# Patient Record
Sex: Female | Born: 1990 | Race: Black or African American | Hispanic: No | Marital: Single | Smoking: Never smoker
Health system: Southern US, Community
[De-identification: ages and names within clinical notes are randomized; demographics above are authoritative.]

---

## 2019-06-12 ENCOUNTER — Emergency Department (HOSPITAL_COMMUNITY): Payer: Self-pay

## 2019-06-12 ENCOUNTER — Other Ambulatory Visit: Payer: Self-pay

## 2019-06-12 ENCOUNTER — Encounter (HOSPITAL_COMMUNITY): Payer: Self-pay | Admitting: *Deleted

## 2019-06-12 ENCOUNTER — Emergency Department (HOSPITAL_COMMUNITY)
Admission: EM | Admit: 2019-06-12 | Discharge: 2019-06-12 | Disposition: A | Discharge status: Home / Self Care | Payer: Self-pay | Attending: Emergency Medicine | Admitting: Emergency Medicine

## 2019-06-12 DIAGNOSIS — N72 Inflammatory disease of cervix uteri: Secondary | ICD-10-CM | POA: Insufficient documentation

## 2019-06-12 DIAGNOSIS — R10815 Periumbilic abdominal tenderness: Secondary | ICD-10-CM | POA: Insufficient documentation

## 2019-06-12 DIAGNOSIS — R1032 Left lower quadrant pain: Secondary | ICD-10-CM

## 2019-06-12 DIAGNOSIS — B373 Candidiasis of vulva and vagina: Secondary | ICD-10-CM | POA: Insufficient documentation

## 2019-06-12 DIAGNOSIS — B379 Candidiasis, unspecified: Secondary | ICD-10-CM

## 2019-06-12 LAB — CBC WITH DIFFERENTIAL/PLATELET
Abs Immature Granulocytes: 0.02 10*3/uL (ref 0.00–0.07)
Basophils Absolute: 0 10*3/uL (ref 0.0–0.1)
Basophils Relative: 0 %
Eosinophils Absolute: 0.1 10*3/uL (ref 0.0–0.5)
Eosinophils Relative: 1 %
HCT: 34.9 % — ABNORMAL LOW (ref 36.0–46.0)
Hemoglobin: 13 g/dL (ref 12.0–15.0)
Immature Granulocytes: 0 %
Lymphocytes Relative: 23 %
Lymphs Abs: 1.8 10*3/uL (ref 0.7–4.0)
MCH: 29.8 pg (ref 26.0–34.0)
MCHC: 37.2 g/dL — ABNORMAL HIGH (ref 30.0–36.0)
MCV: 80 fL (ref 80.0–100.0)
Monocytes Absolute: 0.5 10*3/uL (ref 0.1–1.0)
Monocytes Relative: 6 %
Neutro Abs: 5.7 10*3/uL (ref 1.7–7.7)
Neutrophils Relative %: 70 %
Platelets: 330 10*3/uL (ref 150–400)
RBC: 4.36 MIL/uL (ref 3.87–5.11)
RDW: 13.1 % (ref 11.5–15.5)
WBC: 8.1 10*3/uL (ref 4.0–10.5)
nRBC: 0 % (ref 0.0–0.2)

## 2019-06-12 LAB — URINALYSIS, ROUTINE W REFLEX MICROSCOPIC
Bacteria, UA: NONE SEEN
Bilirubin Urine: NEGATIVE
Glucose, UA: NEGATIVE mg/dL
Ketones, ur: NEGATIVE mg/dL
Leukocytes,Ua: NEGATIVE
Nitrite: NEGATIVE
Protein, ur: NEGATIVE mg/dL
Specific Gravity, Urine: 1.028 (ref 1.005–1.030)
pH: 5 (ref 5.0–8.0)

## 2019-06-12 LAB — COMPREHENSIVE METABOLIC PANEL
ALT: 15 U/L (ref 0–44)
AST: 13 U/L — ABNORMAL LOW (ref 15–41)
Albumin: 4 g/dL (ref 3.5–5.0)
Alkaline Phosphatase: 92 U/L (ref 38–126)
Anion gap: 10 (ref 5–15)
BUN: 10 mg/dL (ref 6–20)
CO2: 24 mmol/L (ref 22–32)
Calcium: 9 mg/dL (ref 8.9–10.3)
Chloride: 101 mmol/L (ref 98–111)
Creatinine, Ser: 0.75 mg/dL (ref 0.44–1.00)
GFR calc Af Amer: >60 mL/min (ref >60)
GFR calc non Af Amer: >60 mL/min (ref >60)
Glucose, Bld: 145 mg/dL — ABNORMAL HIGH (ref 70–99)
Potassium: 3.8 mmol/L (ref 3.5–5.1)
Sodium: 135 mmol/L (ref 135–145)
Total Bilirubin: 0.5 mg/dL (ref 0.3–1.2)
Total Protein: 7.6 g/dL (ref 6.5–8.1)

## 2019-06-12 LAB — I-STAT BETA HCG BLOOD, ED (MC, WL, AP ONLY): I-stat hCG, quantitative: <5 m[IU]/mL (ref <5)

## 2019-06-12 LAB — WET PREP, GENITAL
Clue Cells Wet Prep HPF POC: NONE SEEN
Sperm: NONE SEEN
Trich, Wet Prep: NONE SEEN
WBC, Wet Prep HPF POC: NONE SEEN

## 2019-06-12 MED ORDER — OXYCODONE-ACETAMINOPHEN 5-325 MG PO TABS
1.0000 | ORAL_TABLET | Freq: Once | ORAL | Status: AC
  Administered 2019-06-12: 1 via ORAL
  Filled 2019-06-12: qty 1

## 2019-06-12 MED ORDER — LIDOCAINE HCL (PF) 1 % IJ SOLN
INTRAMUSCULAR | Status: AC
  Administered 2019-06-12: 2.1 mL
  Filled 2019-06-12: qty 5

## 2019-06-12 MED ORDER — KETOROLAC TROMETHAMINE 30 MG/ML IJ SOLN
30.0000 mg | Freq: Once | INTRAMUSCULAR | Status: AC
  Administered 2019-06-12: 10:00:00 30 mg via INTRAVENOUS
  Filled 2019-06-12: qty 1

## 2019-06-12 MED ORDER — FLUCONAZOLE 150 MG PO TABS
150.0000 mg | ORAL_TABLET | Freq: Once | ORAL | Status: AC
  Administered 2019-06-12: 15:00:00 150 mg via ORAL
  Filled 2019-06-12: qty 1

## 2019-06-12 MED ORDER — DOXYCYCLINE HYCLATE 100 MG PO TABS
100.0000 mg | ORAL_TABLET | Freq: Once | ORAL | Status: AC
  Administered 2019-06-12: 100 mg via ORAL
  Filled 2019-06-12: qty 1

## 2019-06-12 MED ORDER — DOXYCYCLINE HYCLATE 100 MG PO CAPS: 100.0000 mg | ORAL_CAPSULE | Freq: Two times a day (BID) | ORAL | 0 refills | Status: AC

## 2019-06-12 MED ORDER — HYDROCODONE-ACETAMINOPHEN 5-325 MG PO TABS: 1.0000 | ORAL_TABLET | Freq: Four times a day (QID) | ORAL | 0 refills | Status: AC | PRN

## 2019-06-12 MED ORDER — METRONIDAZOLE 500 MG PO TABS: 500.0000 mg | ORAL_TABLET | Freq: Two times a day (BID) | ORAL | 0 refills | Status: AC

## 2019-06-12 MED ORDER — HYDROCODONE-ACETAMINOPHEN 5-325 MG PO TABS
1.0000 | ORAL_TABLET | Freq: Once | ORAL | Status: AC
  Administered 2019-06-12: 1 via ORAL
  Filled 2019-06-12: qty 1

## 2019-06-12 MED ORDER — CEFTRIAXONE SODIUM 250 MG IJ SOLR
250.0000 mg | Freq: Once | INTRAMUSCULAR | Status: AC
  Administered 2019-06-12: 250 mg via INTRAMUSCULAR
  Filled 2019-06-12: qty 250

## 2019-06-12 MED ORDER — IOHEXOL 300 MG/ML  SOLN
100.0000 mL | Freq: Once | INTRAMUSCULAR | Status: AC | PRN
  Administered 2019-06-12: 100 mL via INTRAVENOUS

## 2019-06-12 NOTE — ED Notes (Signed)
Patient transported to Ultrasound 

## 2019-06-12 NOTE — Discharge Instructions (Signed)
Take the antibiotics and pain medication as prescribed. Return for new or worsening symptoms. Do not drive or operate heavy machinery while taking the pain medication.

## 2019-06-12 NOTE — ED Triage Notes (Signed)
C/o left lower abd. Pain onset earlier today, denies n/v

## 2019-06-12 NOTE — ED Provider Notes (Signed)
[facility] EMERGENCY DEPARTMENT Provider Note   CSN: [id number] Arrival date & time: 06/12/19  0744   History   Chief Complaint Chief Complaint  Patient presents with   Abdominal Pain   HPI Sherry Hooper is a 28 y.o. female with medical history significant for C-section who presents for evaluation of abdominal pain.  Patient states she developed lower quadrant abdominal pain which began approximately at midnight, 8 hours PTA.  Patient describes her pain as sharp.  This pain is worse with movement and palpation to her left lower quadrant.  She is been taking Tylenol at home without relief of her pain.  Denies fever, chills, nausea, vomiting, chest pain, shortness of breath, dysuria, diarrhea, constipation, pelvic pain, vaginal discharge.  Patient states she is sexually active however is not concerned for STDs.  Rates her current pain a 10/10.  Denies additional aggravating or alleviating factors.  History obtained from patient and past medical records.  No interpreter was used.     HPI  History reviewed. No pertinent past medical history.  There are no active problems to display for this patient.   Past Surgical History:  Procedure Laterality Date   CESAREAN SECTION       OB History   No obstetric history on file.      Home Medications    Prior to Admission medications   Medication Sig Start Date End Date Taking? Authorizing Provider  doxycycline (VIBRAMYCIN) 100 MG capsule Take 1 capsule (100 mg total) by mouth 2 (two) times daily for 10 days. 06/12/19 06/22/19  Aren Pryde A, PA-C  HYDROcodone-acetaminophen (NORCO/VICODIN) 5-325 MG tablet Take 1 tablet by mouth every 6 (six) hours as needed. 06/12/19   Tylek Boney A, PA-C  metroNIDAZOLE (FLAGYL) 500 MG tablet Take 1 tablet (500 mg total) by mouth 2 (two) times daily. 06/12/19   Arthuro Canelo A, PA-C    Family History No family history on file.  Social History Social History   Tobacco  Use   Smoking status: Never Smoker   Smokeless tobacco: Never Used  Substance Use Topics   Alcohol use: Not on file   Drug use: Not on file     Allergies   Patient has no allergy information on record.   Review of Systems Review of Systems  Constitutional: Negative.   HENT: Negative.   Respiratory: Negative.   Cardiovascular: Negative.   Gastrointestinal: Positive for abdominal pain. Negative for abdominal distention, anal bleeding, blood in stool, constipation, diarrhea, nausea, rectal pain and vomiting.  Genitourinary: Negative.   Musculoskeletal: Negative.   Skin: Negative.   Neurological: Negative.   All other systems reviewed and are negative.    Physical Exam Updated Vital Signs BP 130/87 (BP Location: Right Arm)    Pulse (!) 55    Temp 99.4 F (37.4 C) (Oral)    Resp 16    Ht  (1.575 m)    Wt 86.2 kg    SpO2 100%    BMI 34.75 kg/m   Physical Exam Vitals signs and nursing note reviewed. Exam conducted with a chaperone present.  Constitutional:      General: She is not in acute distress.    Appearance: She is well-developed. She is not ill-appearing or toxic-appearing.  HENT:     Head: Normocephalic and atraumatic.     Mouth/Throat:     Mouth: Mucous membranes are moist.     Pharynx: Oropharynx is clear.  Eyes:     Pupils: Pupils are  equal, round, and reactive to light.  Neck:     Musculoskeletal: Normal range of motion.  Cardiovascular:     Rate and Rhythm: Normal rate.     Heart sounds: Normal heart sounds.  Pulmonary:     Effort: Pulmonary effort is normal. No respiratory distress.     Breath sounds: Normal breath sounds.  Abdominal:     General: Bowel sounds are normal. There is no distension.     Palpations: Abdomen is soft.     Tenderness: There is abdominal tenderness in the suprapubic area and left lower quadrant. There is no right CVA tenderness, left CVA tenderness, guarding or rebound.     Hernia: There is no hernia in the left  inguinal area or right inguinal area.    Genitourinary:    General: Normal vulva.     Pubic Area: No rash.      Labia:        Right: No rash, tenderness, lesion or injury.        Left: No rash, tenderness, lesion or injury.        Comments: Normal appearing external female genitalia without rashes or lesions, normal vaginal epithelium. Normal appearing cervix with yellow discharge. No cervical petechiae. Cervical os is closed. There is no bleeding noted at the os. No Odor. Bimanual: Mild CMT, No adnexal tenderness.  No palpable adnexal masses or tenderness. Uterus midline and not fixed. Rectovaginal exam was deferred.  No cystocele or rectocele noted. No pelvic lymphadenopathy noted. Wet prep was obtained.  Cultures for gonorrhea and chlamydia collected. Exam performed with chaperone in room. Musculoskeletal: Normal range of motion.     Comments: Moves all 4 extremities without difficulty.  Lymphadenopathy:     Lower Body: No right inguinal adenopathy. No left inguinal adenopathy.  Skin:    General: Skin is warm and dry.  Neurological:     Mental Status: She is alert.    ED Treatments / Results  Labs (all labs ordered are listed, but only abnormal results are displayed) Labs Reviewed  WET PREP, GENITAL - Abnormal; Notable for the following components:      Result Value   Yeast Wet Prep HPF POC PRESENT (*)    All other components within normal limits  CBC WITH DIFFERENTIAL/PLATELET - Abnormal; Notable for the following components:   HCT 34.9 (*)    MCHC 37.2 (*)    All other components within normal limits  COMPREHENSIVE METABOLIC PANEL - Abnormal; Notable for the following components:   Glucose, Bld 145 (*)    AST 13 (*)    All other components within normal limits  URINALYSIS, ROUTINE W REFLEX MICROSCOPIC - Abnormal; Notable for the following components:   APPearance HAZY (*)    Hgb urine dipstick SMALL (*)    All other components within normal limits  I-STAT BETA HCG  BLOOD, ED ([facility], [facility], [facility] ONLY)  GC/CHLAMYDIA PROBE AMP ([place]    EKG None  Radiology [place] Transvaginal Non-ob  Result Date: 06/12/2019 CLINICAL DATA:  Left lower quadrant pain EXAM: TRANSABDOMINAL AND TRANSVAGINAL ULTRASOUND OF PELVIS DOPPLER ULTRASOUND OF OVARIES TECHNIQUE: Both transabdominal and transvaginal ultrasound examinations of the pelvis were performed. Transabdominal technique was performed for global imaging of the pelvis including uterus, ovaries, adnexal regions, and pelvic cul-de-sac. It was necessary to proceed with endovaginal exam following the transabdominal exam to visualize the uterus, endometrium, ovaries, and adnexa. Color and duplex Doppler ultrasound was utilized to evaluate blood flow to the  ovaries. COMPARISON:  None. FINDINGS: Uterus Measurements: 9.1 x 5.6 x 6.9 = volume: 184 mL. No fibroids or other mass visualized. Endometrium Thickness: 20 mm. No focal abnormality visualized. Decidual appearance. Right ovary Measurements: 2.8 x 2.3 x 2.9 cm = volume: 10 mL. Normal appearance/no adnexal mass. Subcentimeter follicles. Left ovary Measurements: 3.6 x 1.3 x 2.8 cm = volume: 7 mL. Normal appearance/no adnexal mass. Subcentimeter follicles. Pulsed Doppler evaluation of both ovaries demonstrates normal low-resistance arterial and venous waveforms. Other findings No abnormal free fluid. IMPRESSION: No ultrasound abnormality of the pelvis to explain left lower quadrant pain. Physiologic, decidual appearance of the endometrium. Subcentimeter ovarian follicles, normal in the reproductive age setting. Electronically Signed   By: Lauralyn Primes M.D.   On: 06/12/2019 11:30   US Pelvis Complete  Result Date: 06/12/2019 CLINICAL DATA:  Left lower quadrant pain EXAM: TRANSABDOMINAL AND TRANSVAGINAL ULTRASOUND OF PELVIS DOPPLER ULTRASOUND OF OVARIES TECHNIQUE: Both transabdominal and transvaginal ultrasound examinations of the pelvis were performed. Transabdominal technique  was performed for global imaging of the pelvis including uterus, ovaries, adnexal regions, and pelvic cul-de-sac. It was necessary to proceed with endovaginal exam following the transabdominal exam to visualize the uterus, endometrium, ovaries, and adnexa. Color and duplex Doppler ultrasound was utilized to evaluate blood flow to the ovaries. COMPARISON:  None. FINDINGS: Uterus Measurements: 9.1 x 5.6 x 6.9 = volume: 184 mL. No fibroids or other mass visualized. Endometrium Thickness: 20 mm. No focal abnormality visualized. Decidual appearance. Right ovary Measurements: 2.8 x 2.3 x 2.9 cm = volume: 10 mL. Normal appearance/no adnexal mass. Subcentimeter follicles. Left ovary Measurements: 3.6 x 1.3 x 2.8 cm = volume: 7 mL. Normal appearance/no adnexal mass. Subcentimeter follicles. Pulsed Doppler evaluation of both ovaries demonstrates normal low-resistance arterial and venous waveforms. Other findings No abnormal free fluid. IMPRESSION: No ultrasound abnormality of the pelvis to explain left lower quadrant pain. Physiologic, decidual appearance of the endometrium. Subcentimeter ovarian follicles, normal in the reproductive age setting. Electronically Signed   By: Lauralyn Primes M.D.   On: 06/12/2019 11:30   Ct Abdomen Pelvis W Contrast  Result Date: 06/12/2019 CLINICAL DATA:  Generalized abdominal pain. EXAM: CT ABDOMEN AND PELVIS WITH CONTRAST TECHNIQUE: Multidetector CT imaging of the abdomen and pelvis was performed using the standard protocol following bolus administration of intravenous contrast. CONTRAST:  OMNIPAQUE IOHEXOL 300 MG/ML  SOLN COMPARISON:  None. FINDINGS: Lower chest: No acute abnormality. Hepatobiliary: No focal liver abnormality is seen. No gallstones, gallbladder wall thickening, or biliary dilatation. Pancreas: Unremarkable. No pancreatic ductal dilatation or surrounding inflammatory changes. Spleen: Normal in size without focal abnormality. Adrenals/Urinary Tract: Adrenal glands are  unremarkable. Kidneys are normal, without renal calculi, focal lesion, or hydronephrosis. Bladder is unremarkable. Stomach/Bowel: Stomach is within normal limits. Appendix appears normal. No evidence of bowel wall thickening, distention, or inflammatory changes. Vascular/Lymphatic: No significant vascular findings are present. No enlarged abdominal or pelvic lymph nodes. Reproductive: Uterus and bilateral adnexa are unremarkable. Other: No abdominal wall hernia or abnormality. No abdominopelvic ascites. Musculoskeletal: No acute or significant osseous findings. IMPRESSION: No abnormality seen in the abdomen or pelvis. Electronically Signed   By: Lupita Raider M.D.   On: 06/12/2019 12:49   [place] Art/ven Flow Abd Pelv Doppler  Result Date: 06/12/2019 CLINICAL DATA:  Left lower quadrant pain EXAM: TRANSABDOMINAL AND TRANSVAGINAL ULTRASOUND OF PELVIS DOPPLER ULTRASOUND OF OVARIES TECHNIQUE: Both transabdominal and transvaginal ultrasound examinations of the pelvis were performed. Transabdominal technique was performed for global imaging of the  pelvis including uterus, ovaries, adnexal regions, and pelvic cul-de-sac. It was necessary to proceed with endovaginal exam following the transabdominal exam to visualize the uterus, endometrium, ovaries, and adnexa. Color and duplex Doppler ultrasound was utilized to evaluate blood flow to the ovaries. COMPARISON:  None. FINDINGS: Uterus Measurements: 9.1 x 5.6 x 6.9 = volume: 184 mL. No fibroids or other mass visualized. Endometrium Thickness: 20 mm. No focal abnormality visualized. Decidual appearance. Right ovary Measurements: 2.8 x 2.3 x 2.9 cm = volume: 10 mL. Normal appearance/no adnexal mass. Subcentimeter follicles. Left ovary Measurements: 3.6 x 1.3 x 2.8 cm = volume: 7 mL. Normal appearance/no adnexal mass. Subcentimeter follicles. Pulsed Doppler evaluation of both ovaries demonstrates normal low-resistance arterial and venous waveforms. Other findings No abnormal  free fluid. IMPRESSION: No ultrasound abnormality of the pelvis to explain left lower quadrant pain. Physiologic, decidual appearance of the endometrium. Subcentimeter ovarian follicles, normal in the reproductive age setting. Electronically Signed   By: Eddie Candle M.D.   On: 06/12/2019 11:30    Procedures Procedures (including critical care time)  Medications Ordered in ED Medications  cefTRIAXone (ROCEPHIN) injection 250 mg (has no administration in time range)  doxycycline (VIBRA-TABS) tablet 100 mg (has no administration in time range)  fluconazole (DIFLUCAN) tablet 150 mg (has no administration in time range)  HYDROcodone-acetaminophen (NORCO/VICODIN) 5-325 MG per tablet 1 tablet (1 tablet Oral Given 06/12/19 0915)  ketorolac (TORADOL) 30 MG/ML injection 30 mg (30 mg Intravenous Given 06/12/19 0950)  oxyCODONE-acetaminophen (PERCOCET/ROXICET) 5-325 MG per tablet 1 tablet (1 tablet Oral Given 06/12/19 1156)  iohexol (OMNIPAQUE) 300 MG/ML solution 100 mL (100 mLs Intravenous Contrast Given 06/12/19 1209)   Initial Impression / Assessment and Plan / ED Course  I have reviewed the triage vital signs and the nursing notes.  Pertinent labs & imaging results that were available during my care of the patient were reviewed by me and considered in my medical decision making (see chart for details).  28 year old female appears otherwise well presents for evaluation of left lower quadrant pain.  She is afebrile, nonseptic, non-ill-appearing.  Tenderness to left lower quadrant.  No diarrhea, constipation to suggest diverticulitis.  She is tolerating p.o. intake at home.  Heart and lungs clear.  Normoactive bowel sounds.  No overlying skin changes to abdominal wall.  GU exam with yellow discharge with mild CMT tenderness.  No adnexal tenderness.  Ultrasound evidence of torsion, TOA, cyst.  Patient has not wanting IV pain medication at this time.  Requesting p.o. Percocet which she has taken previously with  relief.   CBC without leukocytosis Metabolic panel with mild hyperglycemia at 145, pregnancy test negative, wet prep with yeast urinalysis negative for infection [place] no torsion, No pelvic pathology  Patient requesting additional pain medication. Still does not want IV medication. Will order additional Norco. Given no cause for pain will order CT.  Ct AP with acute findings possible,llq pain due to cervicitis. Will dc home with abx and give abx in ED.  Patient is nontoxic, nonseptic appearing, in no apparent distress.  Patient's pain and other symptoms adequately managed in emergency department.  Fluid bolus given.  Labs, imaging and vitals reviewed.  Patient does not meet the SIRS or Sepsis criteria.  On repeat exam patient does not have a surgical abdomin and there are no peritoneal signs.  No indication of appendicitis, bowel obstruction, bowel perforation, cholecystitis, diverticulitis, torsion, TOA, PID or ectopic pregnancy.  Patient discharged home with symptomatic treatment and given strict instructions for  follow-up with their primary care physician.  I have also discussed reasons to return immediately to the ER.  Patient expresses understanding and agrees with plan.          Final Clinical Impressions(s) / ED Diagnoses   Final diagnoses:  LLQ pain  Cervicitis  Yeast infection    ED Discharge Orders         Ordered    doxycycline (VIBRAMYCIN) 100 MG capsule  2 times daily     06/12/19 1325    metroNIDAZOLE (FLAGYL) 500 MG tablet  2 times daily     06/12/19 1325    HYDROcodone-acetaminophen (NORCO/VICODIN) 5-325 MG tablet  Every 6 hours PRN     06/12/19 1325           Derriana Oser A, PA-C 06/12/19 1331    Raeford Razor, MD 06/13/19 [phone]

## 2019-06-12 NOTE — ED Notes (Signed)
ED Provider at bedside. 

## 2019-06-14 LAB — CERVICOVAGINAL ANCILLARY ONLY
Chlamydia: NEGATIVE
Neisseria Gonorrhea: NEGATIVE

## 2020-12-05 IMAGING — US US PELVIS COMPLETE
2 series · 13 of 25 positions shown · non-contrast
Comparison: None.

CLINICAL DATA: Left lower quadrant pain

EXAM:
TRANSABDOMINAL AND TRANSVAGINAL ULTRASOUND OF PELVIS
DOPPLER ULTRASOUND OF OVARIES
TECHNIQUE: Both transabdominal and transvaginal ultrasound examinations of the
pelvis were performed. Transabdominal technique was performed for
global imaging of the pelvis including uterus, ovaries, adnexal
regions, and pelvic cul-de-sac.
It was necessary to proceed with endovaginal exam following the
transabdominal exam to visualize the uterus, endometrium, ovaries,
and adnexa. Color and duplex Doppler ultrasound was utilized to
evaluate blood flow to the ovaries.

[Series 1: us pelvis complete · 12 of 100 slices shown (1 of 2)]
[im 1/100]
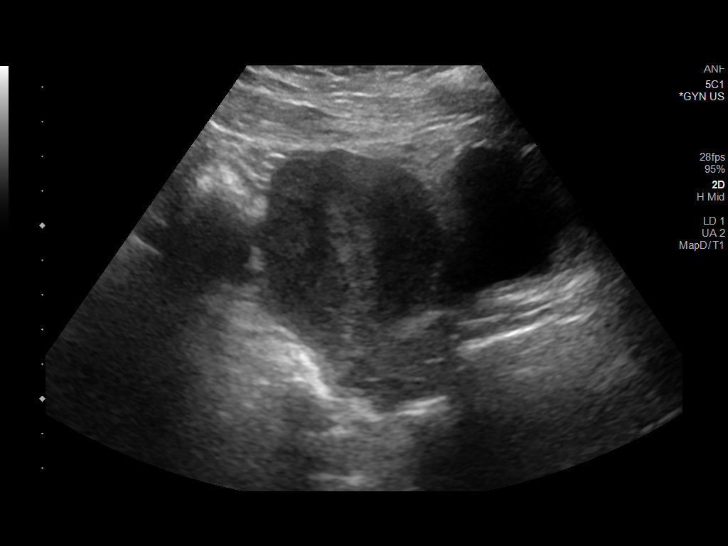
[im 9/100]
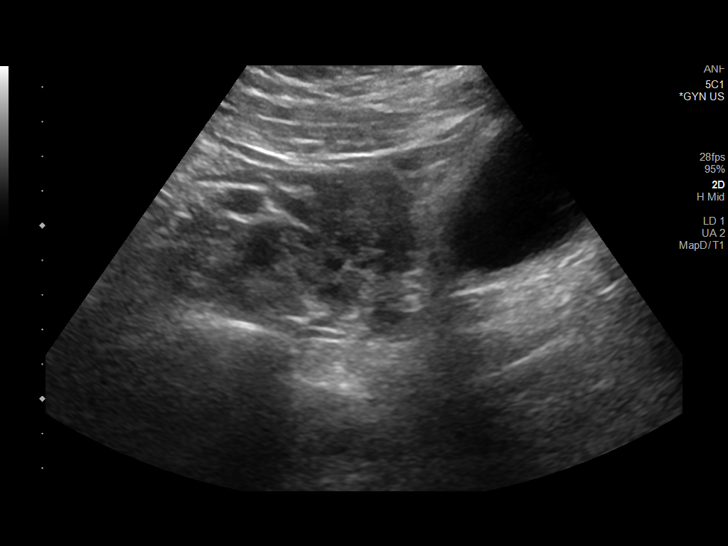
[im 18/100]
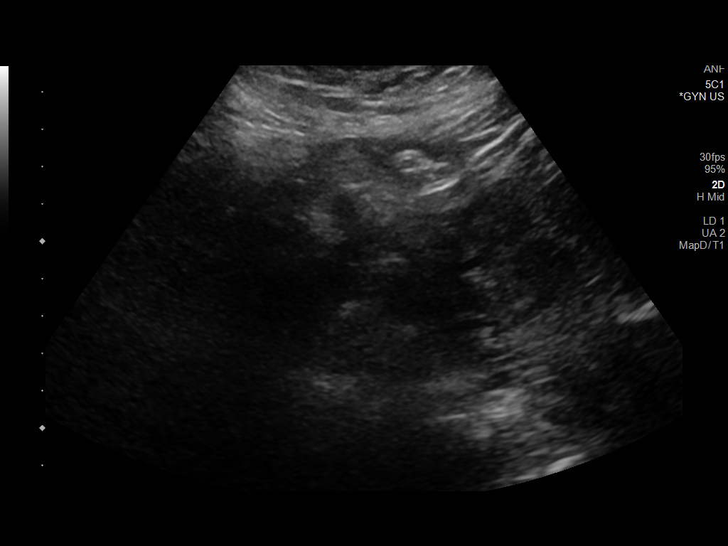
[im 26/100]
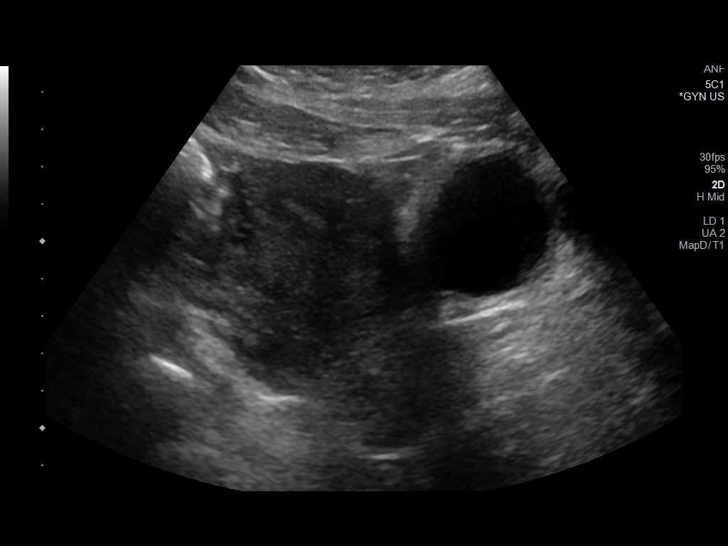
[im 35/100]
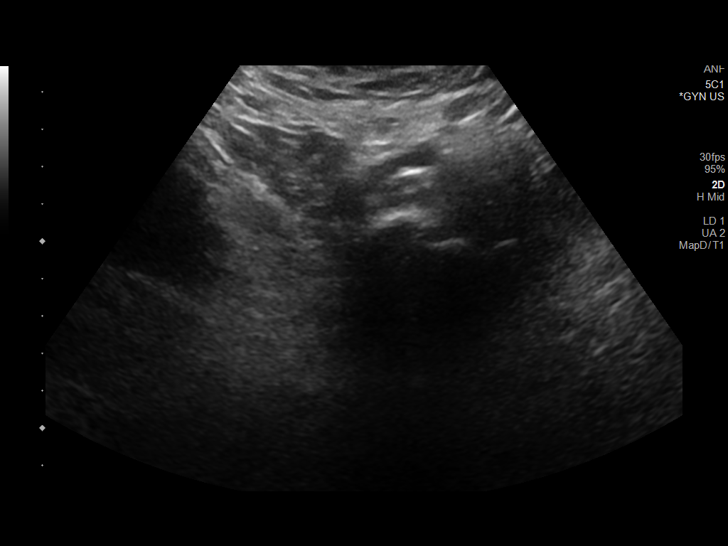
[im 44/100]
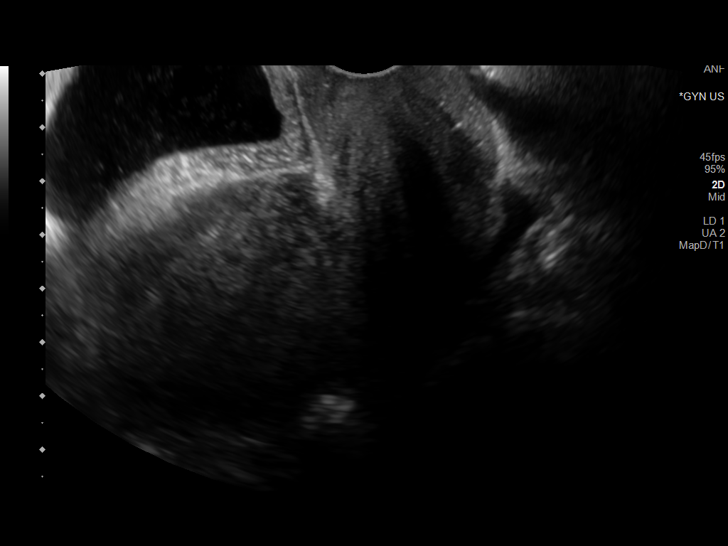
[im 52/100]
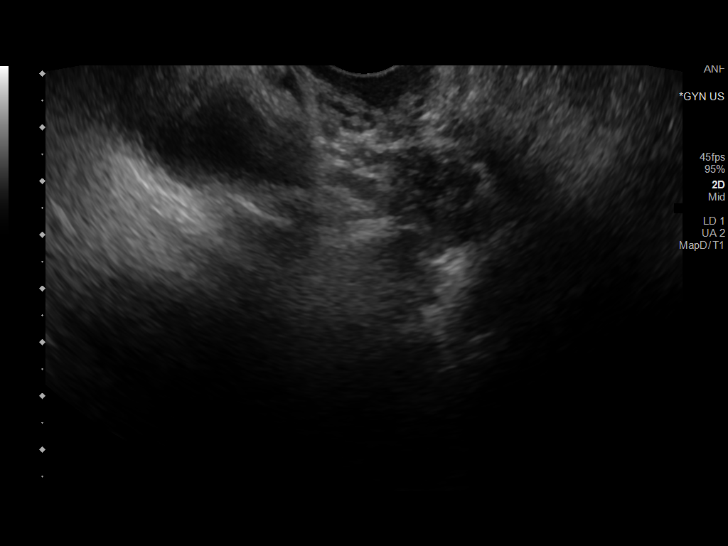
[im 61/100]
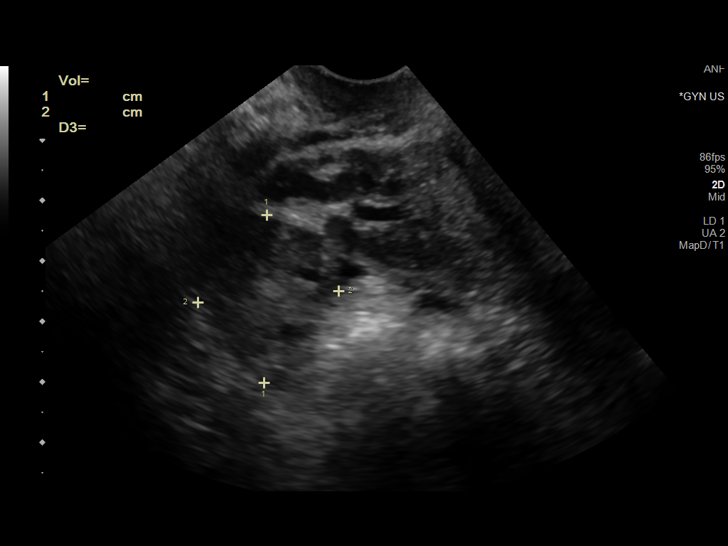
[im 69/100]
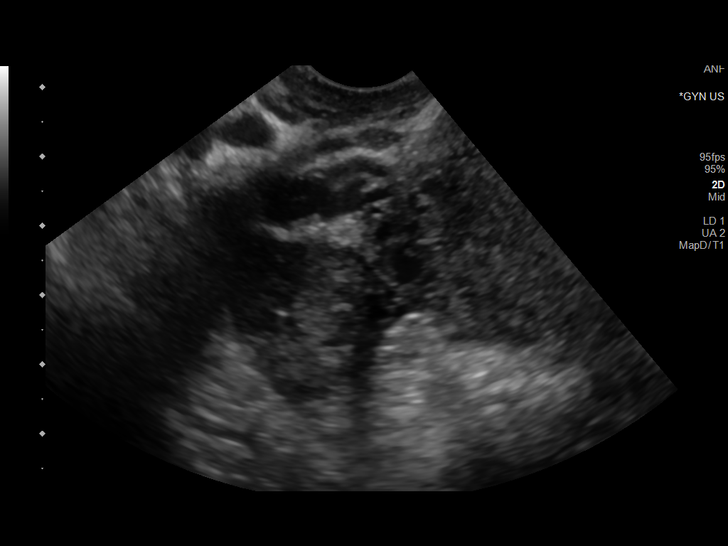
[im 78/100]
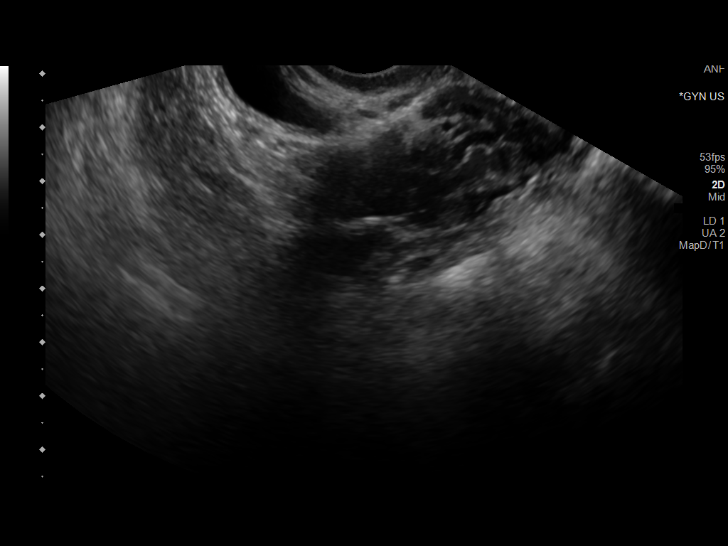
[im 87/100]
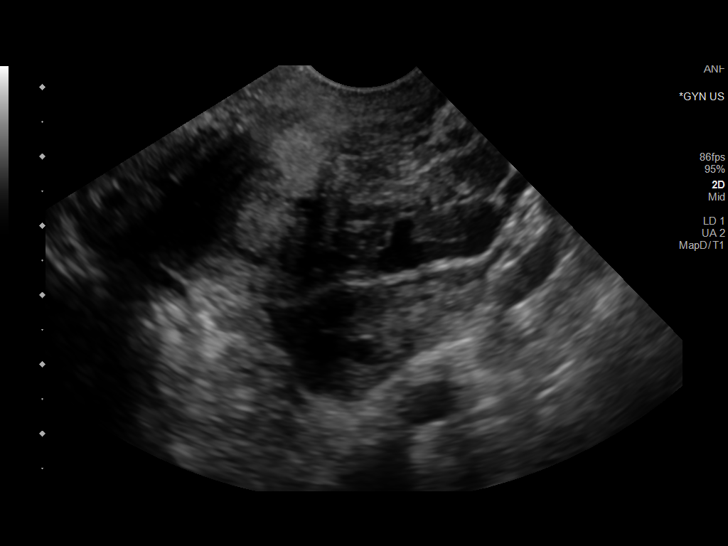
[im 95/100]
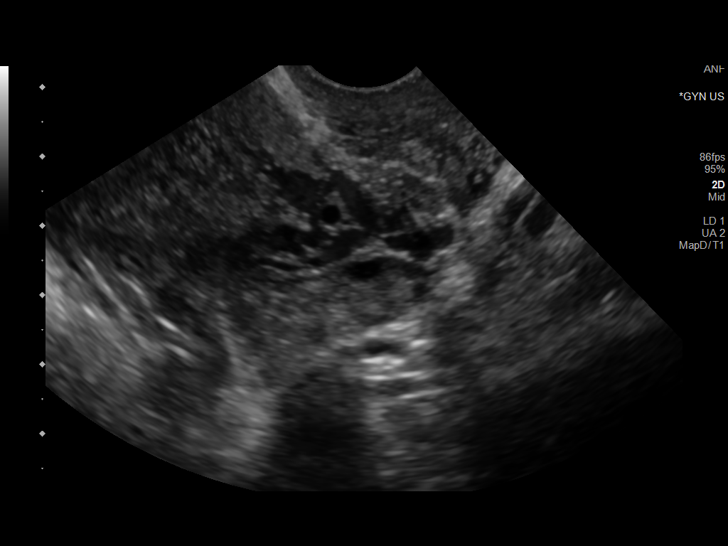

[Series 2: us pelvis complete · 1 of 2 slices shown (2 of 2)]
[im 1/2]
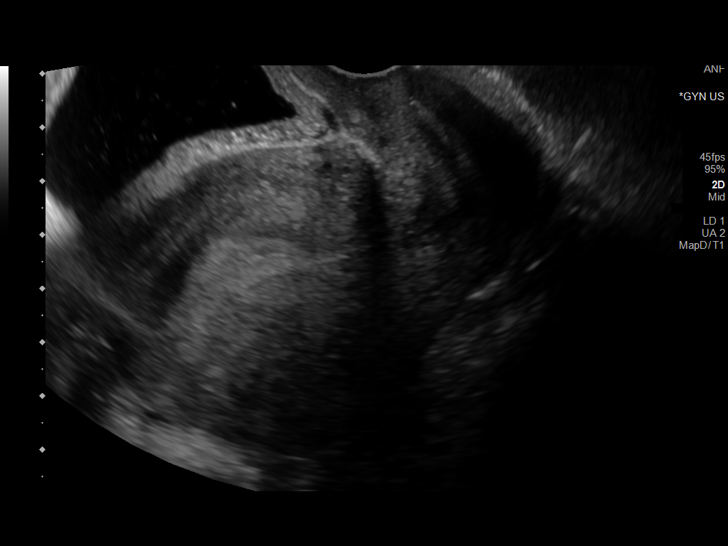

[13 of 25 positions shown; findings below may reference images not displayed]

FINDINGS: Uterus

Measurements: 9.1 x 5.6 x 6.9 = volume: 184 mL. No fibroids or other
mass visualized.

Endometrium

Thickness: 20 mm. No focal abnormality visualized. Decidual
appearance.

Right ovary

Measurements: 2.8 x 2.3 x 2.9 cm = volume: 10 mL. Normal
appearance/no adnexal mass. Subcentimeter follicles.

Left ovary

Measurements: 3.6 x 1.3 x 2.8 cm = volume: 7 mL. Normal
appearance/no adnexal mass. Subcentimeter follicles.

Pulsed Doppler evaluation of both ovaries demonstrates normal
low-resistance arterial and venous waveforms.

Other findings

No abnormal free fluid.
IMPRESSION: No ultrasound abnormality of the pelvis to explain left lower
quadrant pain. Physiologic, decidual appearance of the endometrium.
Subcentimeter ovarian follicles, normal in the reproductive age
setting.

## 2020-12-05 IMAGING — US US TRANSVAGINAL NON-OB
2 series · 13 of 25 positions shown · non-contrast
Comparison: None.

CLINICAL DATA: Left lower quadrant pain

EXAM:
TRANSABDOMINAL AND TRANSVAGINAL ULTRASOUND OF PELVIS
DOPPLER ULTRASOUND OF OVARIES
TECHNIQUE: Both transabdominal and transvaginal ultrasound examinations of the
pelvis were performed. Transabdominal technique was performed for
global imaging of the pelvis including uterus, ovaries, adnexal
regions, and pelvic cul-de-sac.
It was necessary to proceed with endovaginal exam following the
transabdominal exam to visualize the uterus, endometrium, ovaries,
and adnexa. Color and duplex Doppler ultrasound was utilized to
evaluate blood flow to the ovaries.

[Series 1: us transvaginal non-ob · 12 of 100 slices shown (1 of 2)]
[im 1/100]
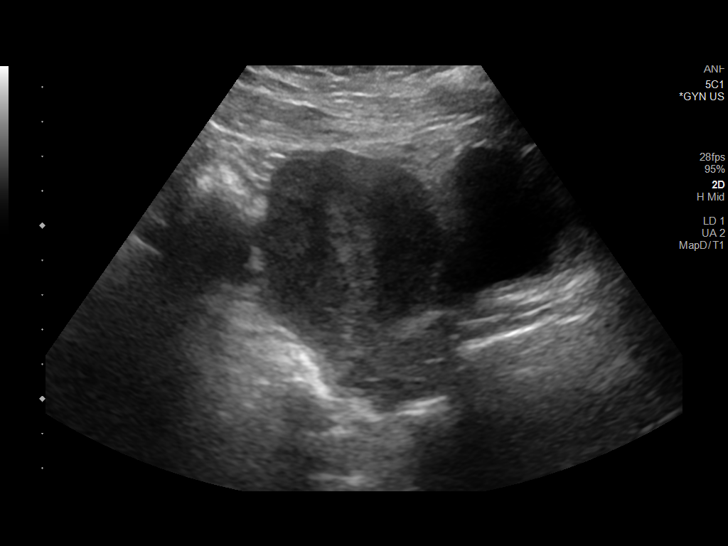
[im 9/100]
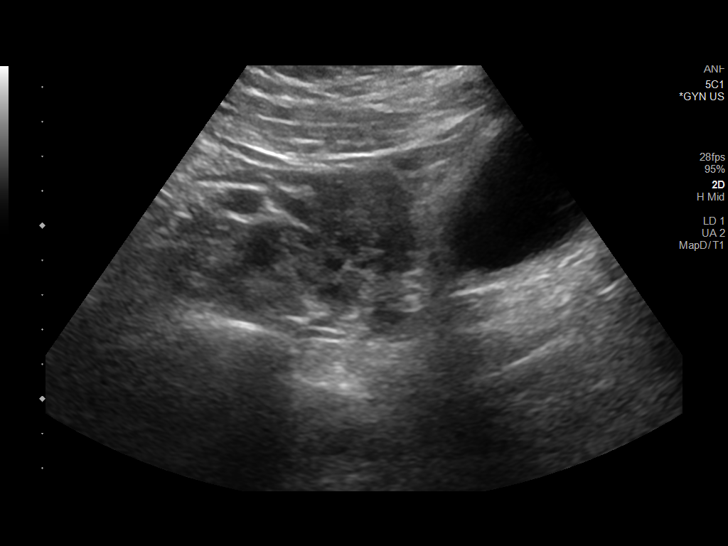
[im 18/100]
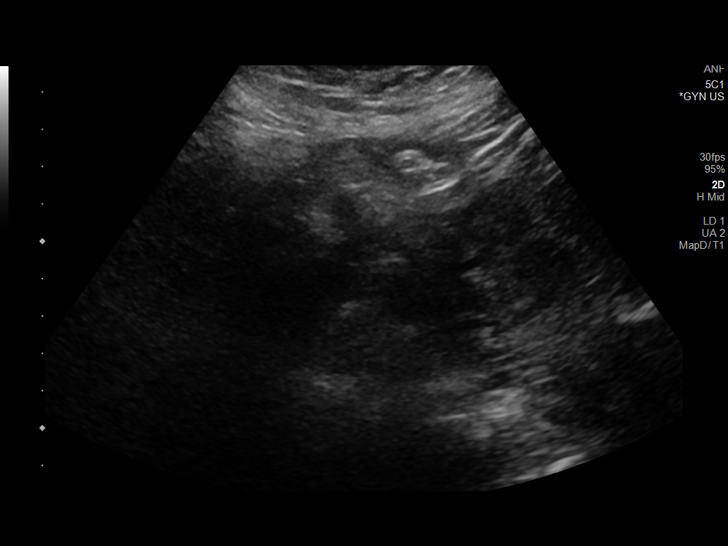
[im 26/100]
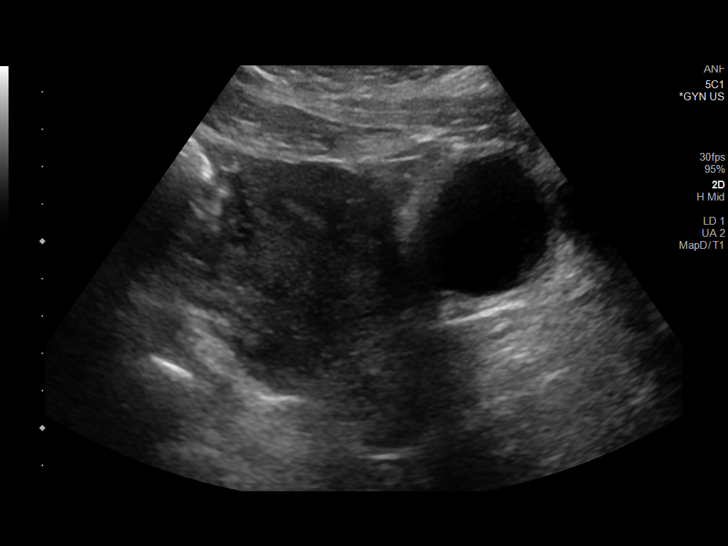
[im 35/100]
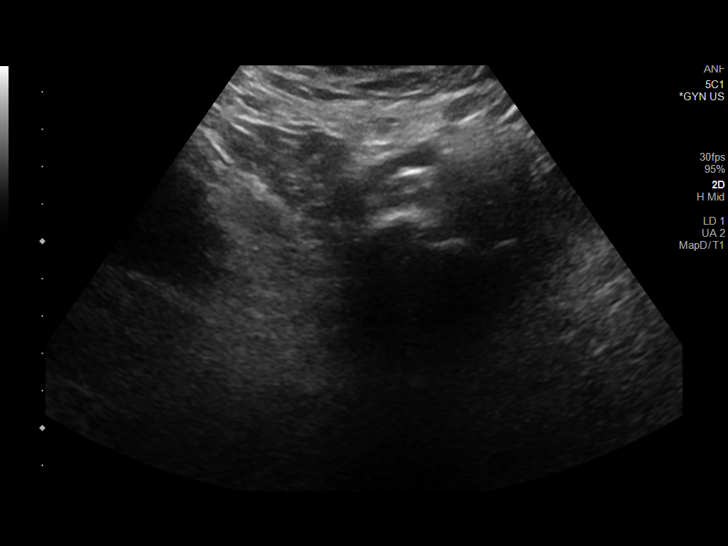
[im 44/100]
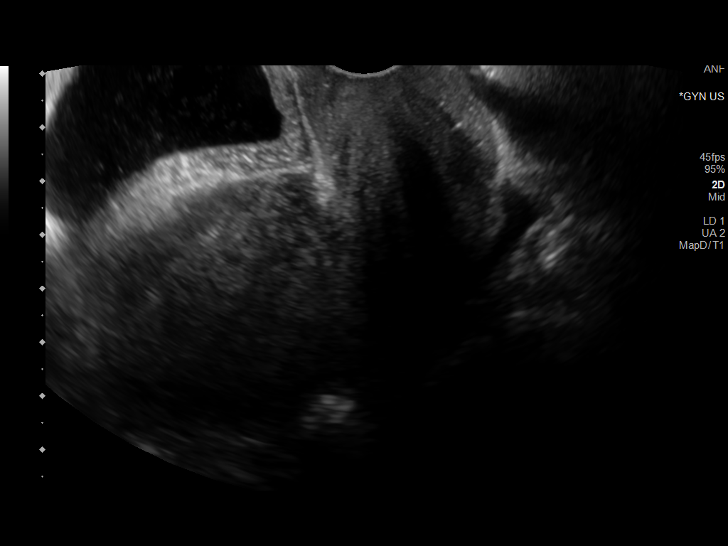
[im 52/100]
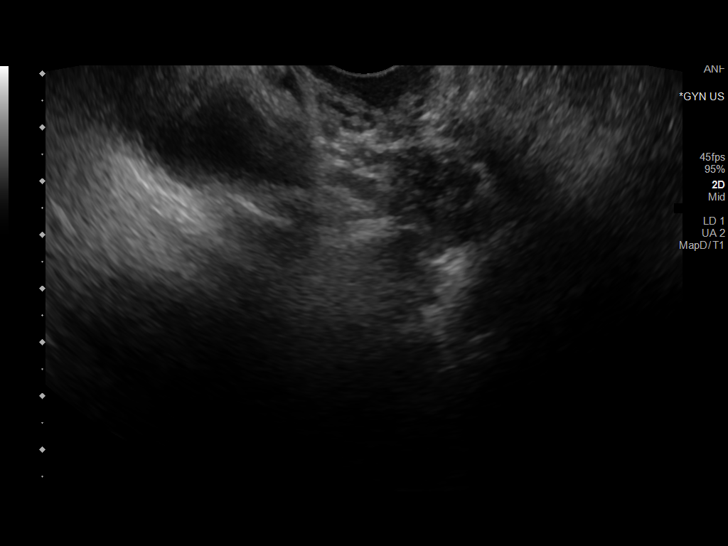
[im 61/100]
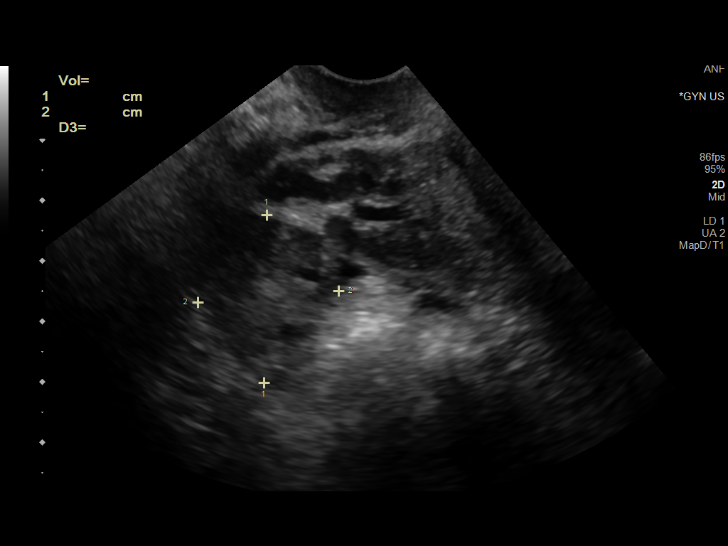
[im 69/100]
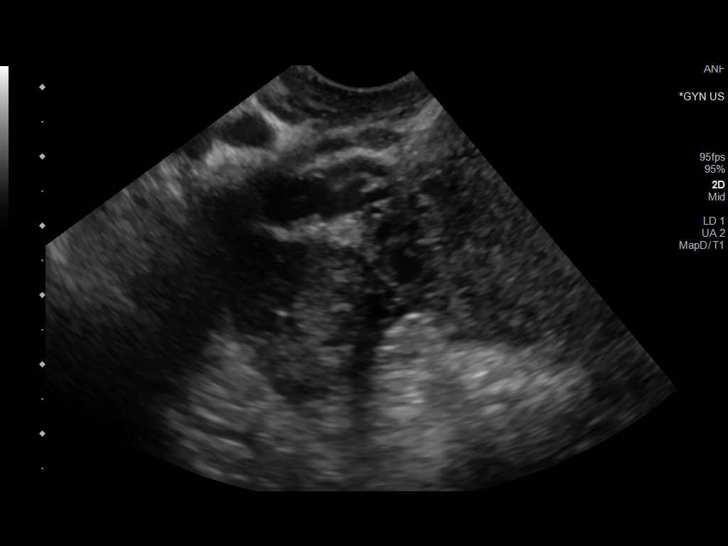
[im 78/100]
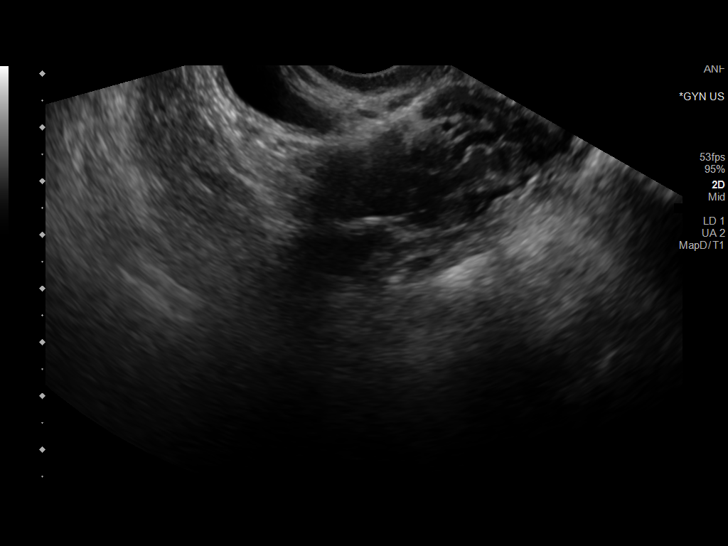
[im 87/100]
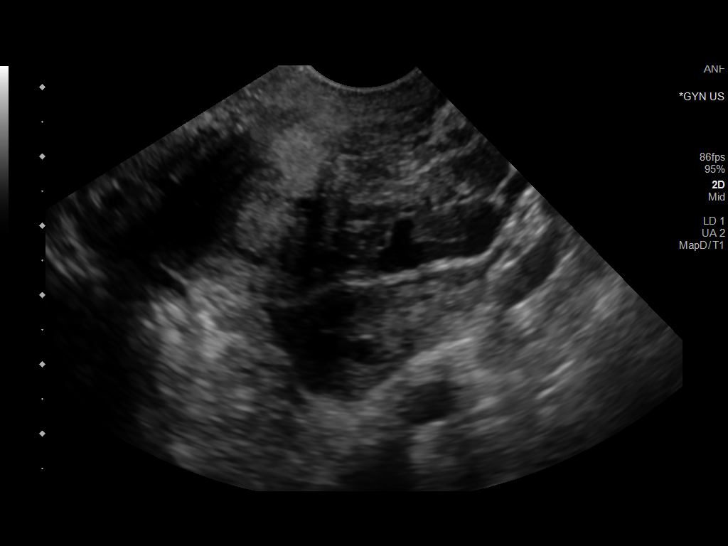
[im 95/100]
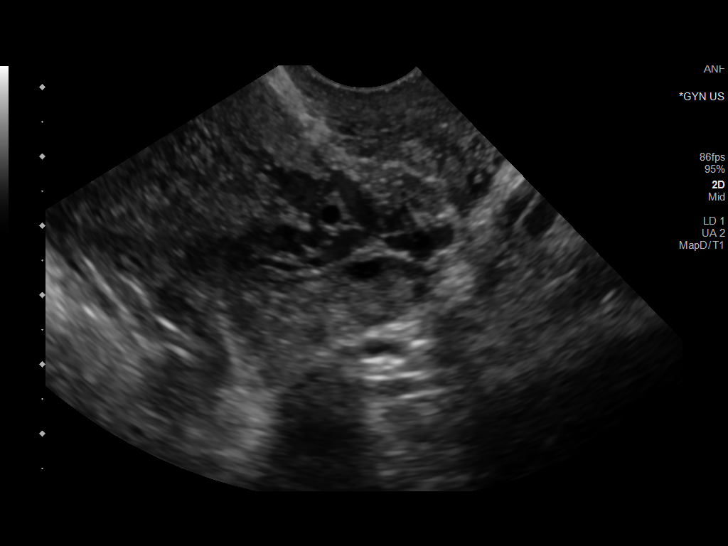

[Series 2: us transvaginal non-ob · 1 of 2 slices shown (2 of 2)]
[im 1/2]
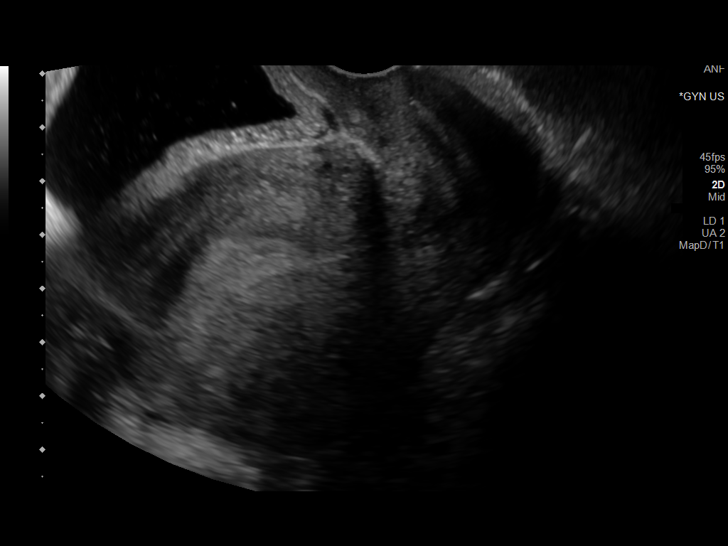

[13 of 25 positions shown; findings below may reference images not displayed]

FINDINGS: Uterus

Measurements: 9.1 x 5.6 x 6.9 = volume: 184 mL. No fibroids or other
mass visualized.

Endometrium

Thickness: 20 mm. No focal abnormality visualized. Decidual
appearance.

Right ovary

Measurements: 2.8 x 2.3 x 2.9 cm = volume: 10 mL. Normal
appearance/no adnexal mass. Subcentimeter follicles.

Left ovary

Measurements: 3.6 x 1.3 x 2.8 cm = volume: 7 mL. Normal
appearance/no adnexal mass. Subcentimeter follicles.

Pulsed Doppler evaluation of both ovaries demonstrates normal
low-resistance arterial and venous waveforms.

Other findings

No abnormal free fluid.
IMPRESSION: No ultrasound abnormality of the pelvis to explain left lower
quadrant pain. Physiologic, decidual appearance of the endometrium.
Subcentimeter ovarian follicles, normal in the reproductive age
setting.

## 2020-12-05 IMAGING — CT CT ABD-PELV W/ CM
2 of 4 series · 16 of 46 positions shown, 18 images · IV contrast (omnipaque)
Comparison: None.

CLINICAL DATA: Generalized abdominal pain.

EXAM:
CT ABDOMEN AND PELVIS WITH CONTRAST
TECHNIQUE: Multidetector CT imaging of the abdomen and pelvis was performed
using the standard protocol following bolus administration of
intravenous contrast.
CONTRAST:  100mL OMNIPAQUE IOHEXOL 300 MG/ML  SOLN

[Series 3: abd/ pelvis 5.0 i30f 2 · axial · 0.98mm/px · z∈[+806,+1276]mm · 13 of 102 slices shown, 15 images]
[im 4/102  soft-tissue]
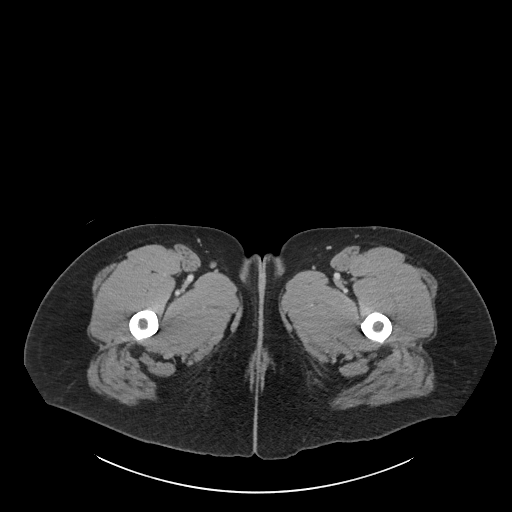
[im 4/102  bone]
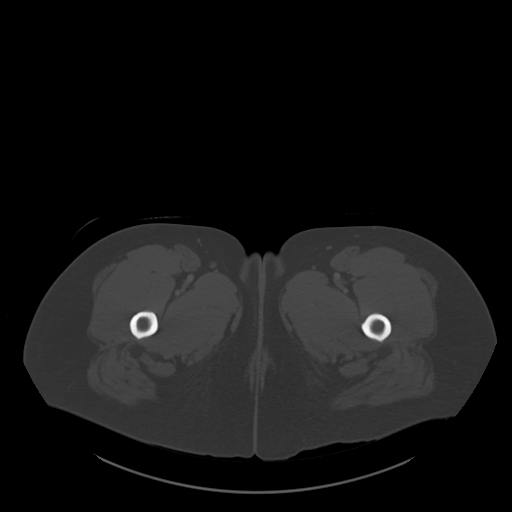
[im 12/102  soft-tissue]
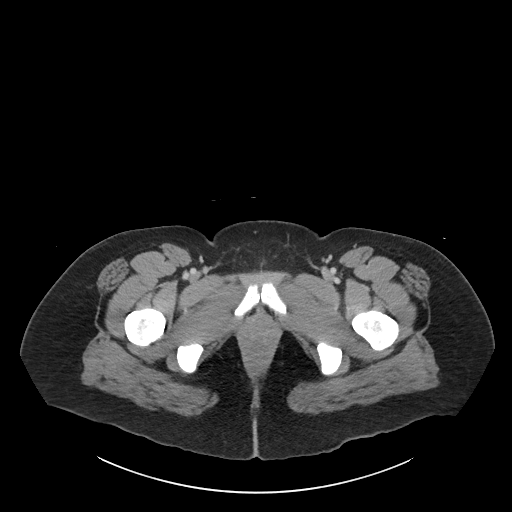
[im 20/102  soft-tissue]
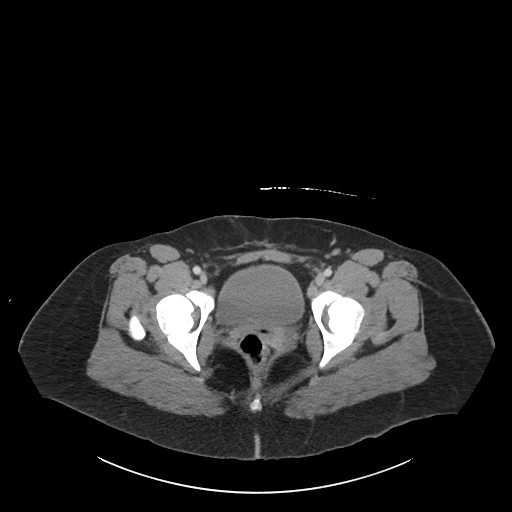
[im 28/102  soft-tissue]
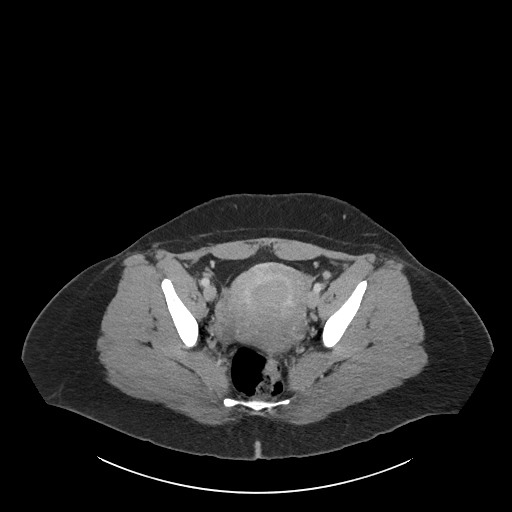
[im 35/102  soft-tissue]
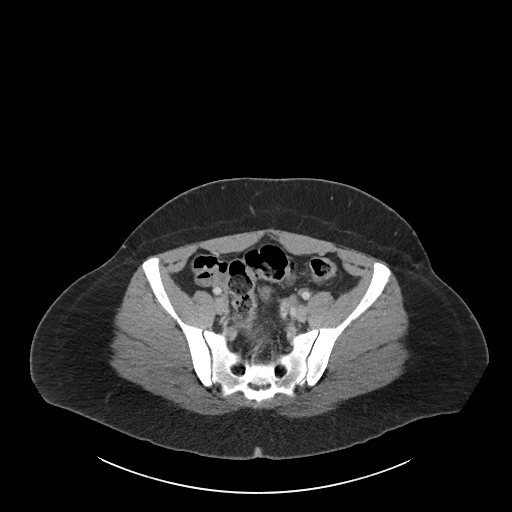
[im 43/102  soft-tissue]
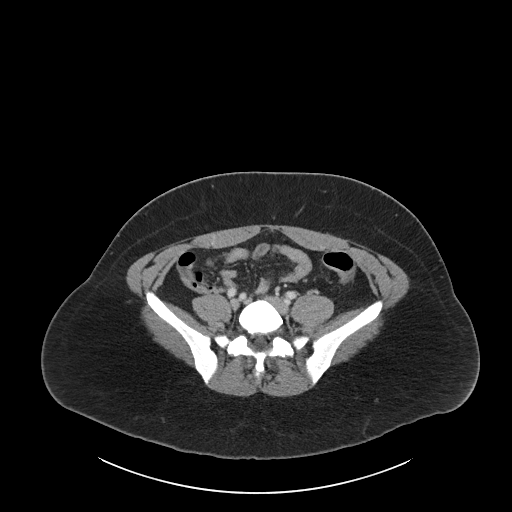
[im 51/102  soft-tissue]
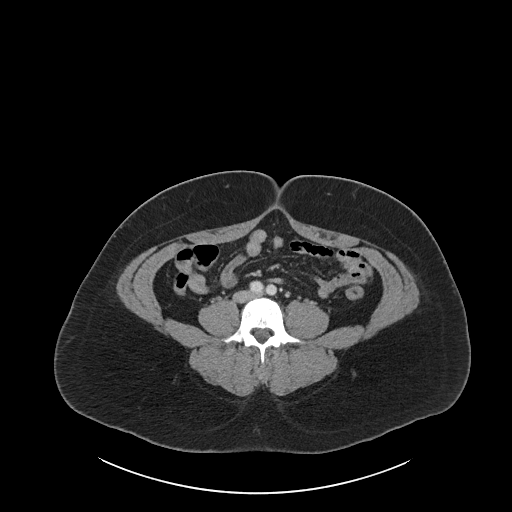
[im 59/102  soft-tissue]
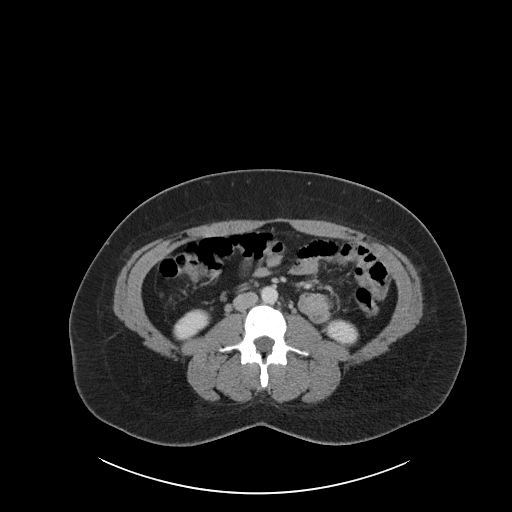
[im 67/102  soft-tissue]
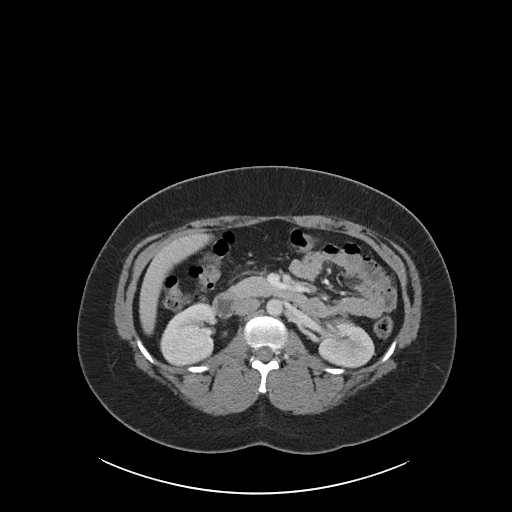
[im 67/102  bone]
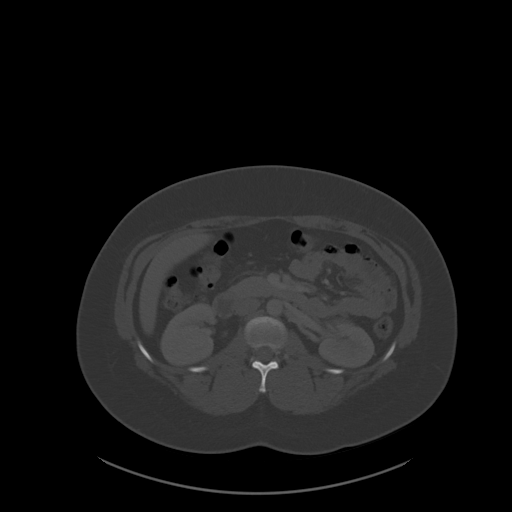
[im 74/102  soft-tissue]
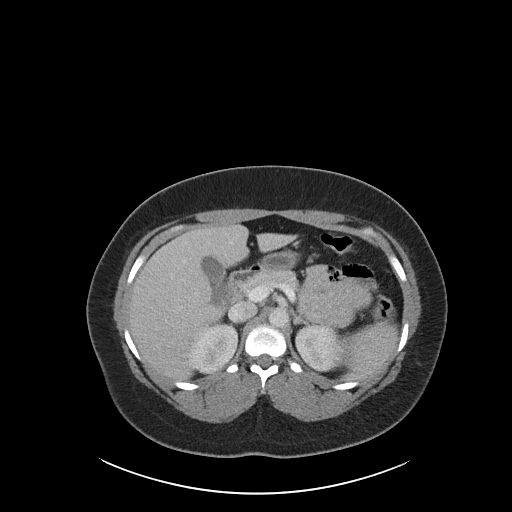
[im 82/102  soft-tissue]
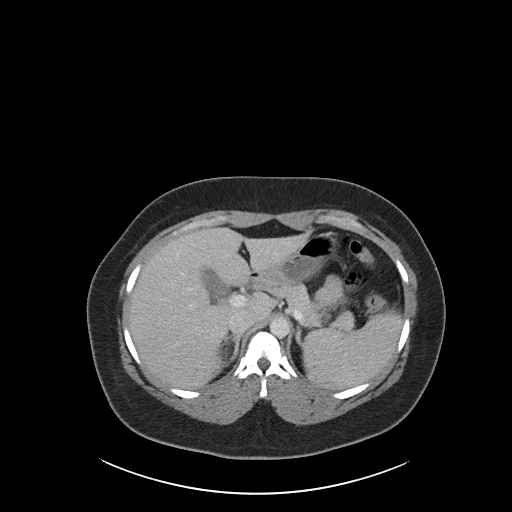
[im 90/102  soft-tissue]
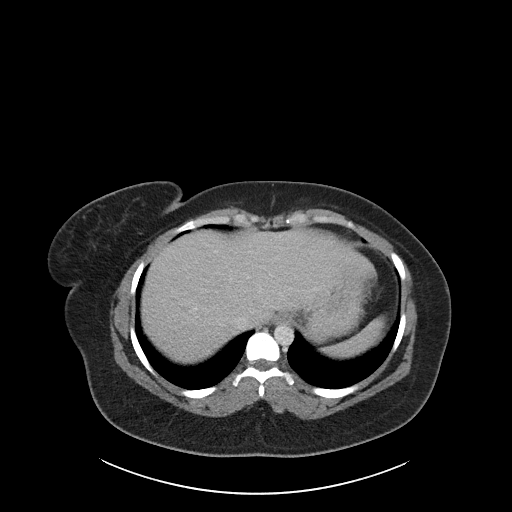
[im 98/102  soft-tissue]
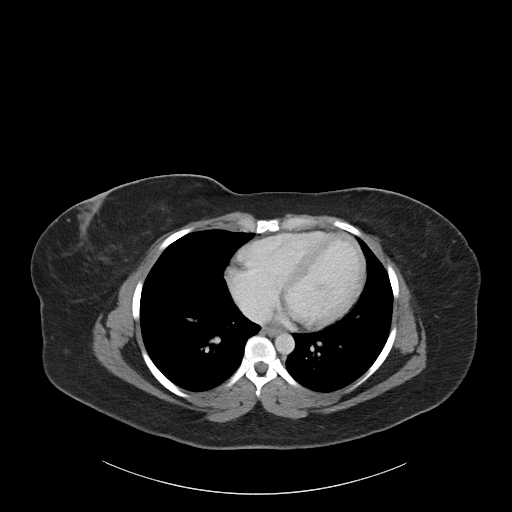

[Series 6: coronal soft tissue · coronal · 0.80mm/px · 3 of 111 slices shown]
[im 37/111  soft-tissue]
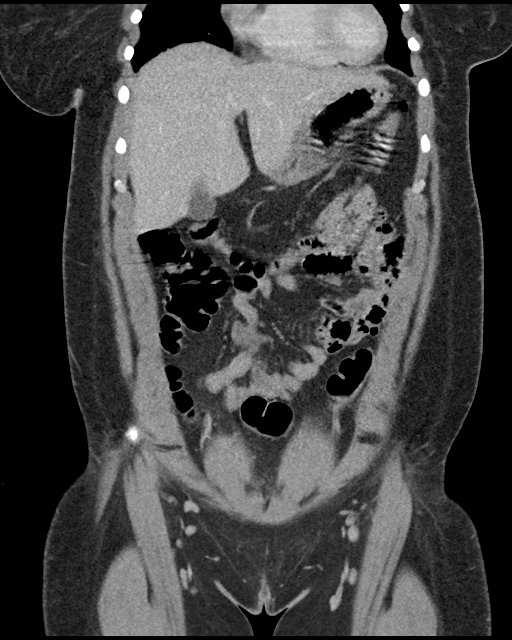
[im 49/111  soft-tissue]
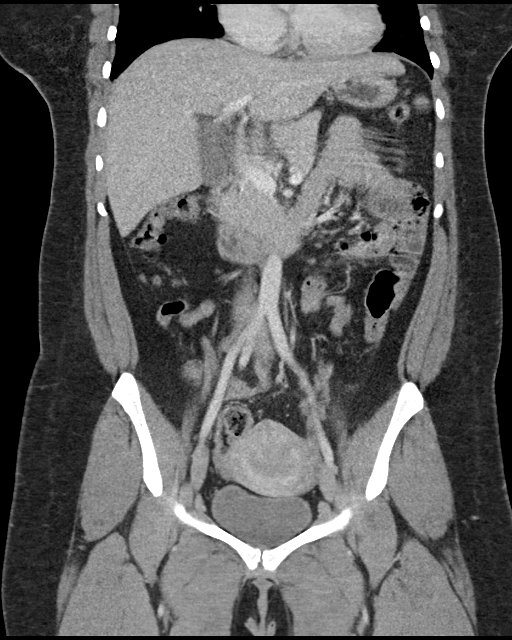
[im 62/111  soft-tissue]
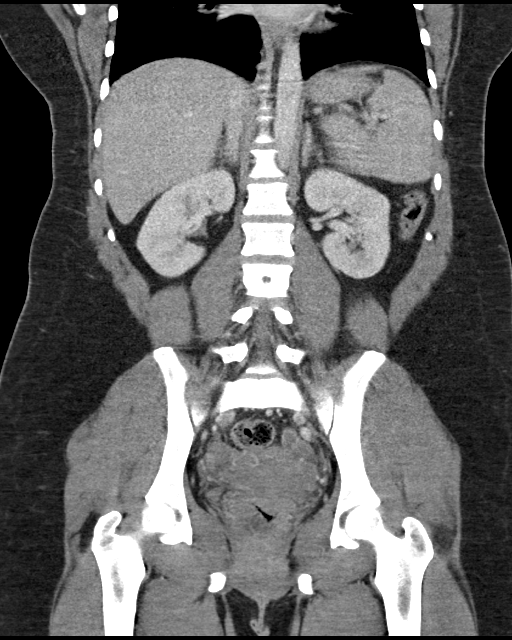

[16 of 46 positions shown; findings below may reference images not displayed]

FINDINGS: Lower chest: No acute abnormality.

Hepatobiliary: No focal liver abnormality is seen. No gallstones,
gallbladder wall thickening, or biliary dilatation.

Pancreas: Unremarkable. No pancreatic ductal dilatation or
surrounding inflammatory changes.

Spleen: Normal in size without focal abnormality.

Adrenals/Urinary Tract: Adrenal glands are unremarkable. Kidneys are
normal, without renal calculi, focal lesion, or hydronephrosis.
Bladder is unremarkable.

Stomach/Bowel: Stomach is within normal limits. Appendix appears
normal. No evidence of bowel wall thickening, distention, or
inflammatory changes.

Vascular/Lymphatic: No significant vascular findings are present. No
enlarged abdominal or pelvic lymph nodes.

Reproductive: Uterus and bilateral adnexa are unremarkable.

Other: No abdominal wall hernia or abnormality. No abdominopelvic
ascites.

Musculoskeletal: No acute or significant osseous findings.
IMPRESSION: No abnormality seen in the abdomen or pelvis.
